# Patient Record
Sex: Female | Born: 1990 | Hispanic: Yes | Marital: Married | Smoking: Never smoker
Health system: Southern US, Community
[De-identification: ages and names within clinical notes are randomized; demographics above are authoritative.]

## PROBLEM LIST (undated history)

## (undated) DIAGNOSIS — F419 Anxiety disorder, unspecified: Secondary | ICD-10-CM

## (undated) DIAGNOSIS — E119 Type 2 diabetes mellitus without complications: Secondary | ICD-10-CM

---

## 2021-10-25 ENCOUNTER — Other Ambulatory Visit: Payer: Self-pay

## 2021-10-25 ENCOUNTER — Encounter (HOSPITAL_BASED_OUTPATIENT_CLINIC_OR_DEPARTMENT_OTHER): Payer: Self-pay

## 2021-10-25 ENCOUNTER — Other Ambulatory Visit (HOSPITAL_BASED_OUTPATIENT_CLINIC_OR_DEPARTMENT_OTHER): Payer: Self-pay

## 2021-10-25 ENCOUNTER — Emergency Department (HOSPITAL_BASED_OUTPATIENT_CLINIC_OR_DEPARTMENT_OTHER): Payer: Self-pay

## 2021-10-25 ENCOUNTER — Emergency Department (HOSPITAL_BASED_OUTPATIENT_CLINIC_OR_DEPARTMENT_OTHER)
Admission: EM | Admit: 2021-10-25 | Discharge: 2021-10-25 | Disposition: A | Discharge status: Home / Self Care | Payer: Self-pay | Attending: Emergency Medicine | Admitting: Emergency Medicine

## 2021-10-25 DIAGNOSIS — F0781 Postconcussional syndrome: Secondary | ICD-10-CM | POA: Insufficient documentation

## 2021-10-25 DIAGNOSIS — R519 Headache, unspecified: Secondary | ICD-10-CM | POA: Diagnosis present

## 2021-10-25 HISTORY — DX: Anxiety disorder, unspecified: F41.9

## 2021-10-25 HISTORY — DX: Type 2 diabetes mellitus without complications: E11.9

## 2021-10-25 LAB — PREGNANCY, URINE: Preg Test, Ur: NEGATIVE

## 2021-10-25 MED ORDER — METOCLOPRAMIDE HCL 10 MG PO TABS
10.0000 mg | ORAL_TABLET | Freq: Three times a day (TID) | ORAL | 0 refills | Status: AC | PRN
  Filled 2021-10-25: qty 10, 4d supply, fill #0

## 2021-10-25 MED ORDER — METHOCARBAMOL 500 MG PO TABS
500.0000 mg | ORAL_TABLET | Freq: Once | ORAL | Status: AC
  Administered 2021-10-25: 500 mg via ORAL
  Filled 2021-10-25: qty 1

## 2021-10-25 MED ORDER — OXYCODONE-ACETAMINOPHEN 5-325 MG PO TABS
1.0000 | ORAL_TABLET | Freq: Once | ORAL | Status: AC
  Administered 2021-10-25: 1 via ORAL
  Filled 2021-10-25: qty 1

## 2021-10-25 MED ORDER — METHOCARBAMOL 500 MG PO TABS
500.0000 mg | ORAL_TABLET | Freq: Three times a day (TID) | ORAL | 0 refills | Status: AC | PRN
  Filled 2021-10-25: qty 20, 7d supply, fill #0

## 2021-10-25 MED ORDER — METOCLOPRAMIDE HCL 10 MG PO TABS
10.0000 mg | ORAL_TABLET | Freq: Once | ORAL | Status: AC
  Administered 2021-10-25: 10 mg via ORAL
  Filled 2021-10-25: qty 1
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
## 2021-10-25 NOTE — Discharge Instructions (Addendum)
I would recommend following up with a primary care doctor or a sports medicine/concussion specialist about your symptoms from today.  Can try the prescribed nausea medicine and muscle relaxer.  Additionally would recommend taking Tylenol and Motrin as needed for the pain.  If you are having worsening pain, any vomiting, fever, or other new concerning symptom, please return to the ER for reassessment.

## 2021-10-29 ENCOUNTER — Encounter (HOSPITAL_BASED_OUTPATIENT_CLINIC_OR_DEPARTMENT_OTHER): Payer: Self-pay | Admitting: Emergency Medicine

## 2021-10-29 ENCOUNTER — Emergency Department (HOSPITAL_BASED_OUTPATIENT_CLINIC_OR_DEPARTMENT_OTHER)
Admission: EM | Admit: 2021-10-29 | Discharge: 2021-10-29 | Disposition: A | Discharge status: Home / Self Care | Payer: Self-pay | Attending: Emergency Medicine | Admitting: Emergency Medicine

## 2021-10-29 ENCOUNTER — Other Ambulatory Visit: Payer: Self-pay

## 2021-10-29 DIAGNOSIS — G44309 Post-traumatic headache, unspecified, not intractable: Secondary | ICD-10-CM | POA: Insufficient documentation

## 2021-10-29 DIAGNOSIS — E119 Type 2 diabetes mellitus without complications: Secondary | ICD-10-CM | POA: Diagnosis not present

## 2021-10-29 DIAGNOSIS — R519 Headache, unspecified: Secondary | ICD-10-CM | POA: Diagnosis present

## 2021-10-29 MED ORDER — KETOROLAC TROMETHAMINE 30 MG/ML IJ SOLN
30.0000 mg | Freq: Once | INTRAMUSCULAR | Status: AC
  Administered 2021-10-29: 30 mg via INTRAMUSCULAR
  Filled 2021-10-29: qty 1

## 2021-10-29 MED ORDER — IBUPROFEN 800 MG PO TABS: 800.0000 mg | ORAL_TABLET | Freq: Three times a day (TID) | ORAL | 0 refills | Status: AC | PRN
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
## 2021-10-29 NOTE — ED Triage Notes (Signed)
Interpreter utilized in triage. Patient c/o headache for the last 4 days with fatigue. States was seen the same without relief.
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
## 2021-11-22 ENCOUNTER — Emergency Department (HOSPITAL_BASED_OUTPATIENT_CLINIC_OR_DEPARTMENT_OTHER)
Admission: EM | Admit: 2021-11-22 | Discharge: 2021-11-23 | Disposition: A | Discharge status: Home / Self Care | Payer: Self-pay | Attending: Emergency Medicine | Admitting: Emergency Medicine

## 2021-11-22 ENCOUNTER — Encounter (HOSPITAL_BASED_OUTPATIENT_CLINIC_OR_DEPARTMENT_OTHER): Payer: Self-pay

## 2021-11-22 ENCOUNTER — Other Ambulatory Visit: Payer: Self-pay

## 2021-11-22 DIAGNOSIS — R519 Headache, unspecified: Secondary | ICD-10-CM | POA: Insufficient documentation

## 2021-11-22 DIAGNOSIS — Z733 Stress, not elsewhere classified: Secondary | ICD-10-CM | POA: Insufficient documentation

## 2021-11-22 DIAGNOSIS — N3090 Cystitis, unspecified without hematuria: Secondary | ICD-10-CM | POA: Insufficient documentation

## 2021-11-22 DIAGNOSIS — F439 Reaction to severe stress, unspecified: Secondary | ICD-10-CM

## 2021-11-22 LAB — CBC WITH DIFFERENTIAL/PLATELET
Abs Immature Granulocytes: 0.02 10*3/uL (ref 0.00–0.07)
Basophils Absolute: 0 10*3/uL (ref 0.0–0.1)
Basophils Relative: 1 %
Eosinophils Absolute: 0.1 10*3/uL (ref 0.0–0.5)
Eosinophils Relative: 1 %
HCT: 38.4 % (ref 36.0–46.0)
Hemoglobin: 13.1 g/dL (ref 12.0–15.0)
Immature Granulocytes: 0 %
Lymphocytes Relative: 33 %
Lymphs Abs: 2.8 10*3/uL (ref 0.7–4.0)
MCH: 28.9 pg (ref 26.0–34.0)
MCHC: 34.1 g/dL (ref 30.0–36.0)
MCV: 84.8 fL (ref 80.0–100.0)
Monocytes Absolute: 0.6 10*3/uL (ref 0.1–1.0)
Monocytes Relative: 7 %
Neutro Abs: 5 10*3/uL (ref 1.7–7.7)
Neutrophils Relative %: 58 %
Platelets: 318 10*3/uL (ref 150–400)
RBC: 4.53 MIL/uL (ref 3.87–5.11)
RDW: 13 % (ref 11.5–15.5)
WBC: 8.5 10*3/uL (ref 4.0–10.5)
nRBC: 0 % (ref 0.0–0.2)

## 2021-11-22 MED ORDER — IBUPROFEN 400 MG PO TABS
600.0000 mg | ORAL_TABLET | Freq: Once | ORAL | Status: AC
  Administered 2021-11-22: 600 mg via ORAL
  Filled 2021-11-22: qty 1

## 2021-11-22 MED ORDER — HYDROXYZINE HCL 25 MG PO TABS
25.0000 mg | ORAL_TABLET | Freq: Once | ORAL | Status: AC
Start: 2021-11-22 — End: 2021-11-22
  Administered 2021-11-22: 25 mg via ORAL
  Filled 2021-11-22: qty 1
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
## 2021-11-23 ENCOUNTER — Other Ambulatory Visit (HOSPITAL_BASED_OUTPATIENT_CLINIC_OR_DEPARTMENT_OTHER): Payer: Self-pay

## 2021-11-23 ENCOUNTER — Telehealth (HOSPITAL_BASED_OUTPATIENT_CLINIC_OR_DEPARTMENT_OTHER): Payer: Self-pay | Admitting: Emergency Medicine

## 2021-11-23 LAB — URINALYSIS, MICROSCOPIC (REFLEX)

## 2021-11-23 LAB — URINALYSIS, ROUTINE W REFLEX MICROSCOPIC
Bilirubin Urine: NEGATIVE
Glucose, UA: NEGATIVE mg/dL
Ketones, ur: NEGATIVE mg/dL
Leukocytes,Ua: NEGATIVE
Nitrite: POSITIVE — AB
Protein, ur: NEGATIVE mg/dL
Specific Gravity, Urine: 1.025 (ref 1.005–1.030)
pH: 7 (ref 5.0–8.0)

## 2021-11-23 LAB — COMPREHENSIVE METABOLIC PANEL
ALT: 14 U/L (ref 0–44)
AST: 17 U/L (ref 15–41)
Albumin: 4 g/dL (ref 3.5–5.0)
Alkaline Phosphatase: 52 U/L (ref 38–126)
Anion gap: 7 (ref 5–15)
BUN: 8 mg/dL (ref 6–20)
CO2: 26 mmol/L (ref 22–32)
Calcium: 9.1 mg/dL (ref 8.9–10.3)
Chloride: 105 mmol/L (ref 98–111)
Creatinine, Ser: 0.69 mg/dL (ref 0.44–1.00)
GFR, Estimated: >60 mL/min (ref >60)
Glucose, Bld: 113 mg/dL — ABNORMAL HIGH (ref 70–99)
Potassium: 3.7 mmol/L (ref 3.5–5.1)
Sodium: 138 mmol/L (ref 135–145)
Total Bilirubin: 0.8 mg/dL (ref 0.3–1.2)
Total Protein: 7.7 g/dL (ref 6.5–8.1)

## 2021-11-23 LAB — PREGNANCY, URINE: Preg Test, Ur: NEGATIVE

## 2021-11-23 MED ORDER — ONDANSETRON 4 MG PO TBDP
4.0000 mg | ORAL_TABLET | Freq: Three times a day (TID) | ORAL | 0 refills | Status: DC | PRN
  Filled 2021-11-23: qty 10, 4d supply, fill #0

## 2021-11-23 MED ORDER — ONDANSETRON 4 MG PO TBDP: 4.0000 mg | ORAL_TABLET | Freq: Three times a day (TID) | ORAL | 0 refills | Status: AC | PRN

## 2021-11-23 MED ORDER — HYDROXYZINE HCL 25 MG PO TABS
25.0000 mg | ORAL_TABLET | Freq: Three times a day (TID) | ORAL | 0 refills | Status: DC | PRN
  Filled 2021-11-23: qty 12, 4d supply, fill #0

## 2021-11-23 MED ORDER — HYDROXYZINE HCL 25 MG PO TABS: 25.0000 mg | ORAL_TABLET | Freq: Three times a day (TID) | ORAL | 0 refills | Status: AC | PRN

## 2021-11-23 MED ORDER — NITROFURANTOIN MONOHYD MACRO 100 MG PO CAPS
100.0000 mg | ORAL_CAPSULE | Freq: Two times a day (BID) | ORAL | 0 refills | Status: AC
Start: 2021-11-23 — End: 2021-11-30

## 2021-11-23 MED ORDER — NITROFURANTOIN MONOHYD MACRO 100 MG PO CAPS
100.0000 mg | ORAL_CAPSULE | Freq: Two times a day (BID) | ORAL | 0 refills | Status: DC
  Filled 2021-11-23: qty 10, 5d supply, fill #0

## 2021-11-26 ENCOUNTER — Other Ambulatory Visit (HOSPITAL_BASED_OUTPATIENT_CLINIC_OR_DEPARTMENT_OTHER): Payer: Self-pay

## 2021-11-29 ENCOUNTER — Ambulatory Visit: Payer: No Typology Code available for payment source | Admitting: Psychiatry

## 2022-08-26 ENCOUNTER — Other Ambulatory Visit (HOSPITAL_COMMUNITY): Payer: Self-pay
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
